# Patient Record
Sex: Male | Born: 1960 | Race: Black or African American | Hispanic: No | Smoking: Current every day smoker
Health system: Southern US, Community
[De-identification: ages and names within clinical notes are randomized; demographics above are authoritative.]

## PROBLEM LIST (undated history)

## (undated) DIAGNOSIS — J42 Unspecified chronic bronchitis: Secondary | ICD-10-CM

## (undated) DIAGNOSIS — F32A Depression, unspecified: Secondary | ICD-10-CM

## (undated) DIAGNOSIS — I10 Essential (primary) hypertension: Secondary | ICD-10-CM

## (undated) DIAGNOSIS — M1990 Unspecified osteoarthritis, unspecified site: Secondary | ICD-10-CM

## (undated) DIAGNOSIS — F329 Major depressive disorder, single episode, unspecified: Secondary | ICD-10-CM

## (undated) DIAGNOSIS — T884XXA Failed or difficult intubation, initial encounter: Secondary | ICD-10-CM

## (undated) DIAGNOSIS — S82843A Displaced bimalleolar fracture of unspecified lower leg, initial encounter for closed fracture: Secondary | ICD-10-CM

## (undated) DIAGNOSIS — S069X9A Unspecified intracranial injury with loss of consciousness of unspecified duration, initial encounter: Secondary | ICD-10-CM

## (undated) HISTORY — PX: INGUINAL HERNIA REPAIR: SUR1180

---

## 2018-01-20 ENCOUNTER — Emergency Department (HOSPITAL_COMMUNITY): Payer: Medicaid Other

## 2018-01-20 ENCOUNTER — Inpatient Hospital Stay (HOSPITAL_COMMUNITY)
Admission: EM | Admit: 2018-01-20 | Discharge: 2018-02-01 | DRG: 492 | Disposition: A | Discharge status: Home Health | Payer: Medicaid Other | Attending: General Surgery | Admitting: General Surgery

## 2018-01-20 ENCOUNTER — Encounter (HOSPITAL_COMMUNITY): Payer: Self-pay | Admitting: Emergency Medicine

## 2018-01-20 DIAGNOSIS — R402312 Coma scale, best motor response, none, at arrival to emergency department: Secondary | ICD-10-CM | POA: Diagnosis present

## 2018-01-20 DIAGNOSIS — Z23 Encounter for immunization: Secondary | ICD-10-CM | POA: Diagnosis not present

## 2018-01-20 DIAGNOSIS — R402222 Coma scale, best verbal response, incomprehensible words, at arrival to emergency department: Secondary | ICD-10-CM | POA: Diagnosis present

## 2018-01-20 DIAGNOSIS — R402112 Coma scale, eyes open, never, at arrival to emergency department: Secondary | ICD-10-CM | POA: Diagnosis present

## 2018-01-20 DIAGNOSIS — F17200 Nicotine dependence, unspecified, uncomplicated: Secondary | ICD-10-CM | POA: Diagnosis present

## 2018-01-20 DIAGNOSIS — F10129 Alcohol abuse with intoxication, unspecified: Secondary | ICD-10-CM | POA: Diagnosis present

## 2018-01-20 DIAGNOSIS — S01511A Laceration without foreign body of lip, initial encounter: Secondary | ICD-10-CM | POA: Diagnosis present

## 2018-01-20 DIAGNOSIS — S82843A Displaced bimalleolar fracture of unspecified lower leg, initial encounter for closed fracture: Secondary | ICD-10-CM

## 2018-01-20 DIAGNOSIS — J309 Allergic rhinitis, unspecified: Secondary | ICD-10-CM | POA: Diagnosis present

## 2018-01-20 DIAGNOSIS — J9601 Acute respiratory failure with hypoxia: Secondary | ICD-10-CM | POA: Diagnosis present

## 2018-01-20 DIAGNOSIS — Z09 Encounter for follow-up examination after completed treatment for conditions other than malignant neoplasm: Secondary | ICD-10-CM

## 2018-01-20 DIAGNOSIS — I10 Essential (primary) hypertension: Secondary | ICD-10-CM | POA: Diagnosis present

## 2018-01-20 DIAGNOSIS — S069XAA Unspecified intracranial injury with loss of consciousness status unknown, initial encounter: Secondary | ICD-10-CM

## 2018-01-20 DIAGNOSIS — G629 Polyneuropathy, unspecified: Secondary | ICD-10-CM | POA: Diagnosis present

## 2018-01-20 DIAGNOSIS — S9002XA Contusion of left ankle, initial encounter: Secondary | ICD-10-CM

## 2018-01-20 DIAGNOSIS — S060X9A Concussion with loss of consciousness of unspecified duration, initial encounter: Secondary | ICD-10-CM | POA: Diagnosis present

## 2018-01-20 DIAGNOSIS — S82842A Displaced bimalleolar fracture of left lower leg, initial encounter for closed fracture: Secondary | ICD-10-CM | POA: Diagnosis present

## 2018-01-20 DIAGNOSIS — M25572 Pain in left ankle and joints of left foot: Secondary | ICD-10-CM | POA: Diagnosis present

## 2018-01-20 DIAGNOSIS — S82899A Other fracture of unspecified lower leg, initial encounter for closed fracture: Secondary | ICD-10-CM

## 2018-01-20 DIAGNOSIS — R402431 Glasgow coma scale score 3-8, in the field [EMT or ambulance]: Secondary | ICD-10-CM

## 2018-01-20 DIAGNOSIS — S069X9A Unspecified intracranial injury with loss of consciousness of unspecified duration, initial encounter: Secondary | ICD-10-CM

## 2018-01-20 DIAGNOSIS — S0990XA Unspecified injury of head, initial encounter: Secondary | ICD-10-CM

## 2018-01-20 HISTORY — DX: Depression, unspecified: F32.A

## 2018-01-20 HISTORY — DX: Essential (primary) hypertension: I10

## 2018-01-20 HISTORY — DX: Unspecified osteoarthritis, unspecified site: M19.90

## 2018-01-20 HISTORY — DX: Unspecified intracranial injury with loss of consciousness of unspecified duration, initial encounter: S06.9X9A

## 2018-01-20 HISTORY — DX: Unspecified chronic bronchitis: J42

## 2018-01-20 HISTORY — DX: Unspecified intracranial injury with loss of consciousness status unknown, initial encounter: S06.9XAA

## 2018-01-20 HISTORY — DX: Failed or difficult intubation, initial encounter: T88.4XXA

## 2018-01-20 HISTORY — DX: Displaced bimalleolar fracture of unspecified lower leg, initial encounter for closed fracture: S82.843A

## 2018-01-20 HISTORY — DX: Major depressive disorder, single episode, unspecified: F32.9

## 2018-01-20 LAB — URINALYSIS, ROUTINE W REFLEX MICROSCOPIC
BACTERIA UA: NONE SEEN
BILIRUBIN URINE: NEGATIVE
Glucose, UA: NEGATIVE mg/dL
KETONES UR: NEGATIVE mg/dL
LEUKOCYTES UA: NEGATIVE
Nitrite: NEGATIVE
Protein, ur: NEGATIVE mg/dL
SQUAMOUS EPITHELIAL / LPF: NONE SEEN
Specific Gravity, Urine: 1.006 (ref 1.005–1.030)
pH: 6 (ref 5.0–8.0)

## 2018-01-20 LAB — I-STAT CG4 LACTIC ACID, ED: LACTIC ACID, VENOUS: 4.9 mmol/L — AB (ref 0.5–1.9)

## 2018-01-20 LAB — PREPARE FRESH FROZEN PLASMA
UNIT DIVISION: 0
Unit division: 0

## 2018-01-20 LAB — CBC
HCT: 37.1 % — ABNORMAL LOW (ref 39.0–52.0)
HEMOGLOBIN: 12.1 g/dL — AB (ref 13.0–17.0)
MCH: 28.5 pg (ref 26.0–34.0)
MCHC: 32.6 g/dL (ref 30.0–36.0)
MCV: 87.5 fL (ref 78.0–100.0)
Platelets: 259 10*3/uL (ref 150–400)
RBC: 4.24 MIL/uL (ref 4.22–5.81)
RDW: 16.1 % — ABNORMAL HIGH (ref 11.5–15.5)
WBC: 4.9 10*3/uL (ref 4.0–10.5)

## 2018-01-20 LAB — TYPE AND SCREEN
ABO/RH(D): O POS
ANTIBODY SCREEN: NEGATIVE
UNIT DIVISION: 0
Unit division: 0

## 2018-01-20 LAB — RAPID URINE DRUG SCREEN, HOSP PERFORMED
AMPHETAMINES: NOT DETECTED
BARBITURATES: NOT DETECTED
BENZODIAZEPINES: POSITIVE — AB
Cocaine: POSITIVE — AB
Opiates: NOT DETECTED
TETRAHYDROCANNABINOL: NOT DETECTED

## 2018-01-20 LAB — I-STAT ARTERIAL BLOOD GAS, ED
ACID-BASE DEFICIT: 3 mmol/L — AB (ref 0.0–2.0)
Bicarbonate: 22 mmol/L (ref 20.0–28.0)
O2 Saturation: 100 %
PO2 ART: 387 mmHg — AB (ref 83.0–108.0)
TCO2: 23 mmol/L (ref 22–32)
pCO2 arterial: 36.5 mmHg (ref 32.0–48.0)
pH, Arterial: 7.383 (ref 7.350–7.450)

## 2018-01-20 LAB — COMPREHENSIVE METABOLIC PANEL
ALT: 33 U/L (ref 17–63)
ANION GAP: 12 (ref 5–15)
AST: 41 U/L (ref 15–41)
Albumin: 3.3 g/dL — ABNORMAL LOW (ref 3.5–5.0)
Alkaline Phosphatase: 56 U/L (ref 38–126)
BUN: 9 mg/dL (ref 6–20)
CHLORIDE: 109 mmol/L (ref 101–111)
CO2: 18 mmol/L — AB (ref 22–32)
Calcium: 8.5 mg/dL — ABNORMAL LOW (ref 8.9–10.3)
Creatinine, Ser: 1.16 mg/dL (ref 0.61–1.24)
GFR calc non Af Amer: >60 mL/min (ref >60)
Glucose, Bld: 118 mg/dL — ABNORMAL HIGH (ref 65–99)
Potassium: 3 mmol/L — ABNORMAL LOW (ref 3.5–5.1)
Sodium: 139 mmol/L (ref 135–145)
Total Bilirubin: 0.4 mg/dL (ref 0.3–1.2)
Total Protein: 5.8 g/dL — ABNORMAL LOW (ref 6.5–8.1)

## 2018-01-20 LAB — I-STAT CHEM 8, ED
BUN: 9 mg/dL (ref 6–20)
CALCIUM ION: 1.09 mmol/L — AB (ref 1.15–1.40)
Chloride: 108 mmol/L (ref 101–111)
Creatinine, Ser: 1.4 mg/dL — ABNORMAL HIGH (ref 0.61–1.24)
GLUCOSE: 114 mg/dL — AB (ref 65–99)
HCT: 39 % (ref 39.0–52.0)
HEMOGLOBIN: 13.3 g/dL (ref 13.0–17.0)
POTASSIUM: 3 mmol/L — AB (ref 3.5–5.1)
Sodium: 143 mmol/L (ref 135–145)
TCO2: 20 mmol/L — ABNORMAL LOW (ref 22–32)

## 2018-01-20 LAB — TRIGLYCERIDES: Triglycerides: 99 mg/dL (ref <150)

## 2018-01-20 LAB — BPAM RBC
BLOOD PRODUCT EXPIRATION DATE: 201904202359
BLOOD PRODUCT EXPIRATION DATE: 201904202359
ISSUE DATE / TIME: 201904130224
ISSUE DATE / TIME: 201904130224
UNIT TYPE AND RH: 9500
Unit Type and Rh: 9500

## 2018-01-20 LAB — PROTIME-INR
INR: 0.98
Prothrombin Time: 12.9 seconds (ref 11.4–15.2)

## 2018-01-20 LAB — ETHANOL: Alcohol, Ethyl (B): 181 mg/dL — ABNORMAL HIGH (ref <10)

## 2018-01-20 LAB — BPAM FFP
Blood Product Expiration Date: 201904132359
Blood Product Expiration Date: 201904142359
ISSUE DATE / TIME: 201904130225
ISSUE DATE / TIME: 201904130225
UNIT TYPE AND RH: 6200
Unit Type and Rh: 6200

## 2018-01-20 LAB — CDS SEROLOGY

## 2018-01-20 LAB — ABO/RH: ABO/RH(D): O POS

## 2018-01-20 LAB — HIV ANTIBODY (ROUTINE TESTING W REFLEX): HIV Screen 4th Generation wRfx: NONREACTIVE

## 2018-01-20 MED ORDER — ROCURONIUM BROMIDE 50 MG/5ML IV SOLN
INTRAVENOUS | Status: AC | PRN
  Administered 2018-01-20: 100 mg via INTRAVENOUS

## 2018-01-20 MED ORDER — SODIUM CHLORIDE 0.9 % IV SOLN
INTRAVENOUS | Status: DC
  Administered 2018-01-20 – 2018-01-22 (×6): via INTRAVENOUS

## 2018-01-20 MED ORDER — FENTANYL CITRATE (PF) 100 MCG/2ML IJ SOLN
INTRAMUSCULAR | Status: AC
  Filled 2018-01-20: qty 2

## 2018-01-20 MED ORDER — FENTANYL CITRATE (PF) 100 MCG/2ML IJ SOLN
INTRAMUSCULAR | Status: AC
  Administered 2018-01-20: 05:00:00
  Filled 2018-01-20: qty 2

## 2018-01-20 MED ORDER — ORAL CARE MOUTH RINSE: 15.0000 mL | OROMUCOSAL | Status: DC

## 2018-01-20 MED ORDER — LIDOCAINE HCL (CARDIAC) 20 MG/ML IV SOLN
INTRAVENOUS | Status: AC | PRN
  Administered 2018-01-20: 100 mg via INTRAVENOUS

## 2018-01-20 MED ORDER — ETOMIDATE 2 MG/ML IV SOLN
INTRAVENOUS | Status: AC | PRN
  Administered 2018-01-20: 20 mg via INTRAVENOUS

## 2018-01-20 MED ORDER — FENTANYL CITRATE (PF) 100 MCG/2ML IJ SOLN
100.0000 ug | INTRAMUSCULAR | Status: DC | PRN
  Administered 2018-01-20 (×2): 100 ug via INTRAVENOUS
  Filled 2018-01-20 (×2): qty 2

## 2018-01-20 MED ORDER — ENOXAPARIN SODIUM 40 MG/0.4ML ~~LOC~~ SOLN
40.0000 mg | SUBCUTANEOUS | Status: DC
  Administered 2018-01-21 – 2018-02-01 (×11): 40 mg via SUBCUTANEOUS
  Filled 2018-01-20 (×11): qty 0.4

## 2018-01-20 MED ORDER — SODIUM CHLORIDE 0.9 % IV BOLUS
1000.0000 mL | Freq: Once | INTRAVENOUS | Status: AC
  Administered 2018-01-20: 1000 mL via INTRAVENOUS

## 2018-01-20 MED ORDER — MIDAZOLAM HCL 2 MG/2ML IJ SOLN
2.0000 mg | INTRAMUSCULAR | Status: DC | PRN
  Administered 2018-01-20 (×2): 2 mg via INTRAVENOUS
  Filled 2018-01-20 (×2): qty 2

## 2018-01-20 MED ORDER — ORAL CARE MOUTH RINSE
15.0000 mL | Freq: Four times a day (QID) | OROMUCOSAL | Status: DC
  Administered 2018-01-20 – 2018-01-21 (×7): 15 mL via OROMUCOSAL

## 2018-01-20 MED ORDER — POTASSIUM CHLORIDE 10 MEQ/100ML IV SOLN
10.0000 meq | INTRAVENOUS | Status: AC
  Administered 2018-01-20 (×3): 10 meq via INTRAVENOUS
  Filled 2018-01-20 (×3): qty 100

## 2018-01-20 MED ORDER — CHLORHEXIDINE GLUCONATE 0.12% ORAL RINSE (MEDLINE KIT)
15.0000 mL | Freq: Two times a day (BID) | OROMUCOSAL | Status: DC
  Administered 2018-01-20 – 2018-01-21 (×3): 15 mL via OROMUCOSAL

## 2018-01-20 MED ORDER — CHLORHEXIDINE GLUCONATE 0.12% ORAL RINSE (MEDLINE KIT): 15.0000 mL | Freq: Two times a day (BID) | OROMUCOSAL | Status: DC

## 2018-01-20 MED ORDER — PROPOFOL 1000 MG/100ML IV EMUL
5.0000 ug/kg/min | INTRAVENOUS | Status: DC
  Administered 2018-01-20: 25 ug/kg/min via INTRAVENOUS
  Administered 2018-01-20: 60 ug/kg/min via INTRAVENOUS
  Administered 2018-01-20: 40 ug/kg/min via INTRAVENOUS
  Administered 2018-01-21: 60 ug/kg/min via INTRAVENOUS
  Administered 2018-01-21: 50 ug/kg/min via INTRAVENOUS
  Filled 2018-01-20 (×4): qty 100

## 2018-01-20 MED ORDER — IOPAMIDOL (ISOVUE-300) INJECTION 61%
100.0000 mL | Freq: Once | INTRAVENOUS | Status: AC | PRN
  Administered 2018-01-20: 100 mL via INTRAVENOUS

## 2018-01-20 MED ORDER — MIDAZOLAM HCL 2 MG/2ML IJ SOLN
INTRAMUSCULAR | Status: AC
  Filled 2018-01-20: qty 2

## 2018-01-20 MED ORDER — MIDAZOLAM HCL 2 MG/2ML IJ SOLN
2.0000 mg | INTRAMUSCULAR | Status: DC | PRN
  Administered 2018-01-20 (×4): 2 mg via INTRAVENOUS
  Filled 2018-01-20 (×3): qty 2

## 2018-01-20 MED ORDER — FENTANYL CITRATE (PF) 100 MCG/2ML IJ SOLN
100.0000 ug | INTRAMUSCULAR | Status: DC | PRN
  Administered 2018-01-20 – 2018-01-21 (×7): 100 ug via INTRAVENOUS
  Filled 2018-01-20 (×6): qty 2

## 2018-01-20 MED ORDER — PROPOFOL 1000 MG/100ML IV EMUL
INTRAVENOUS | Status: AC
  Filled 2018-01-20: qty 100

## 2018-01-20 MED ORDER — METOPROLOL TARTRATE 5 MG/5ML IV SOLN: 5.0000 mg | Freq: Four times a day (QID) | INTRAVENOUS | Status: DC | PRN

## 2018-01-20 MED ORDER — MIDAZOLAM HCL 2 MG/2ML IJ SOLN
INTRAMUSCULAR | Status: AC
  Administered 2018-01-20: 05:00:00
  Filled 2018-01-20: qty 2

## 2018-01-20 NOTE — Progress Notes (Signed)
Please call Detective J.A. Morataya [phone]) with any patient changes. She would like to know if patient wakes up or declines in condition.

## 2018-01-20 NOTE — ED Notes (Signed)
No purposeful movement at this time. 
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
## 2018-01-20 NOTE — Progress Notes (Signed)
Patient awakening and beating on bed rails with bilateral UE. Nods appropriately and following commands. PRN Fentanyl and Versed given for patient safety and cooperation. MD paged and Propofol gtt ordered. Will initiate and work towards SBT with possible extubation. RT notified. Will continue to monitor.   Ulice Dashorie Charmayne Odell, RCharity fundraiser

## 2018-01-20 NOTE — Progress Notes (Signed)
Pt on vent but waking up  Will use propofol for now and try to wean him  Not sure etiology of MS change but work up negative   May be drug/ ETOH related
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
## 2018-01-21 ENCOUNTER — Inpatient Hospital Stay (HOSPITAL_COMMUNITY): Payer: Medicaid Other

## 2018-01-21 LAB — CBC WITH DIFFERENTIAL/PLATELET
BASOS ABS: 0 10*3/uL (ref 0.0–0.1)
BASOS PCT: 0 %
EOS ABS: 0.1 10*3/uL (ref 0.0–0.7)
EOS PCT: 1 %
HCT: 36.6 % — ABNORMAL LOW (ref 39.0–52.0)
Hemoglobin: 11.5 g/dL — ABNORMAL LOW (ref 13.0–17.0)
Lymphocytes Relative: 25 %
Lymphs Abs: 1.9 10*3/uL (ref 0.7–4.0)
MCH: 27.8 pg (ref 26.0–34.0)
MCHC: 31.4 g/dL (ref 30.0–36.0)
MCV: 88.6 fL (ref 78.0–100.0)
Monocytes Absolute: 0.6 10*3/uL (ref 0.1–1.0)
Monocytes Relative: 8 %
Neutro Abs: 5 10*3/uL (ref 1.7–7.7)
Neutrophils Relative %: 66 %
PLATELETS: 235 10*3/uL (ref 150–400)
RBC: 4.13 MIL/uL — ABNORMAL LOW (ref 4.22–5.81)
RDW: 16.5 % — ABNORMAL HIGH (ref 11.5–15.5)
WBC: 7.6 10*3/uL (ref 4.0–10.5)

## 2018-01-21 LAB — MRSA PCR SCREENING: MRSA BY PCR: POSITIVE — AB

## 2018-01-21 LAB — BASIC METABOLIC PANEL
Anion gap: 8 (ref 5–15)
BUN: 5 mg/dL — AB (ref 6–20)
CO2: 26 mmol/L (ref 22–32)
Calcium: 8 mg/dL — ABNORMAL LOW (ref 8.9–10.3)
Chloride: 106 mmol/L (ref 101–111)
Creatinine, Ser: 1.01 mg/dL (ref 0.61–1.24)
Glucose, Bld: 104 mg/dL — ABNORMAL HIGH (ref 65–99)
Potassium: 3.6 mmol/L (ref 3.5–5.1)
SODIUM: 140 mmol/L (ref 135–145)

## 2018-01-21 LAB — SAMPLE TO BLOOD BANK

## 2018-01-21 MED ORDER — MUPIROCIN 2 % EX OINT
1.0000 "application " | TOPICAL_OINTMENT | Freq: Two times a day (BID) | CUTANEOUS | Status: AC
  Administered 2018-01-21 – 2018-01-25 (×9): 1 via NASAL
  Filled 2018-01-21 (×2): qty 22

## 2018-01-21 MED ORDER — DEXMEDETOMIDINE HCL IN NACL 200 MCG/50ML IV SOLN
0.4000 ug/kg/h | INTRAVENOUS | Status: DC
  Administered 2018-01-21 (×2): 0.4 ug/kg/h via INTRAVENOUS
  Filled 2018-01-21 (×2): qty 50

## 2018-01-21 MED ORDER — HYDROMORPHONE HCL 1 MG/ML IJ SOLN
1.0000 mg | INTRAMUSCULAR | Status: DC | PRN
  Administered 2018-01-21 – 2018-01-22 (×6): 1 mg via INTRAVENOUS
  Filled 2018-01-21 (×6): qty 1

## 2018-01-21 MED ORDER — CHLORHEXIDINE GLUCONATE CLOTH 2 % EX PADS
6.0000 | MEDICATED_PAD | Freq: Every day | CUTANEOUS | Status: AC
  Administered 2018-01-21 – 2018-01-25 (×5): 6 via TOPICAL

## 2018-01-21 MED ORDER — ORAL CARE MOUTH RINSE
15.0000 mL | Freq: Two times a day (BID) | OROMUCOSAL | Status: DC
  Administered 2018-01-21 – 2018-01-31 (×17): 15 mL via OROMUCOSAL

## 2018-01-21 NOTE — Progress Notes (Signed)
   01/21/18 0800  Vitals  Temp 100 F (37.8 C)  Temp Source Bladder  BP 122/88  MAP (mmHg) 101  BP Location Right Arm  BP Method Automatic  Patient Position (if appropriate) Lying  Pulse Rate 70  Pulse Rate Source Monitor  ECG Heart Rate 74  Cardiac Rhythm NSR  Resp 20  Oxygen Therapy  SpO2 100 %  O2 Device Ventilator  FiO2 (%) 30 %  Pre-WUA / WUA Start  [place] Agitation Sedation Scale (RASS) 2  Pre WUA sedation dose Continuous  RASS Goal -1  Follows commands Yes  Synchrony No  Wake up assessment interventions Sedation decreased 50%  Glasgow Coma Scale  Eye Opening 3  Modified Verbal Response (INTUBATED) 3  Best Motor Response 6  Glasgow Coma Scale Score 12  Provider Notification  Provider Name/Title Esmond Harps White, MD  Time Notified 0800  Notification Type Face-to-face  Notification Reason Other (Comment) (extubate? MRSA?)  Response See new orders  Time of Response 0800

## 2018-01-21 NOTE — Progress Notes (Signed)
   01/21/18 1310  Vitals  Temp 98.8 F (37.1 C)  Pulse Rate 60  ECG Heart Rate (!) 59  Resp 14  Oxygen Therapy  SpO2 100 %  Provider Notification  Provider Name/Title Corliss Skainssuei, MD  Time Notified 1310  Notification Type Page  Notification Reason Other (Comment) (extubation orders)  Response See new orders  Time of Response 1320 (Dr Esmond Harps White)

## 2018-01-21 NOTE — Procedures (Signed)
Extubation Procedure Note  Patient Details:   Name: Wesley Kelly DOB: 1961-05-13 MRN: [id number]   Airway Documentation:  Airway 7.5 mm (Active)  Secured at (cm) 25 cm 01/21/2018 11:25 AM  Measured From Lips 01/21/2018 11:25 AM  Secured Location Right 01/21/2018 11:25 AM  Secured By [employer] Tube Holder 01/21/2018 11:25 AM  Tube Holder Repositioned Yes 01/21/2018 11:25 AM  Cuff Pressure (cm H2O) 26 cm H2O 01/21/2018  4:35 AM  Site Condition Dry 01/21/2018 11:25 AM    Evaluation  O2 sats: stable throughout Complications: No apparent complications Patient did tolerate procedure well. Bilateral Breath Sounds: Clear   Yes   Patient extubated to 4lnc. Vital signs stable at this time. No complications. RN at bedside. RT will continue to monitor.  Ave Filterdkins, Jolena Kittle Williams 01/21/2018, 2:28 PM

## 2018-01-21 NOTE — Progress Notes (Signed)
Orthopedic Tech Progress Note Patient Details:  Wesley Kelly 1961/05/05 [id number]  Ortho Devices Type of Ortho Device: Ace wrap, Post (short leg) splint Ortho Device/Splint Location: LLE Ortho Device/Splint Interventions: Ordered, Application   Post Interventions Patient Tolerated: Well Instructions Provided: Care of device   Jennye MoccasinHughes, [name] 01/21/2018, 5:45 PM

## 2018-01-21 NOTE — Progress Notes (Signed)
   01/21/18 1524  Vitals  Temp 98.8 F (37.1 C)  Pulse Rate (!) 59  ECG Heart Rate (!) 55  Resp 14  Oxygen Therapy  SpO2 100 %  Provider Notification  Provider Name/Title Corliss Skainssuei, MD  Time Notified 1324  Notification Type Page  Notification Reason Requested by patient/family (ankle hurts, xray?)  Response See new orders  Time of Response 1324

## 2018-01-21 NOTE — Progress Notes (Signed)
Patient ID: Wesley Kelly, male   DOB: 1960/12/03, 57 y.o.   MRN: [id number]  Patient now extubated.  Awake and alert.  Complaining to nurse about left ankle pain and swelling.  X-rays - preliminary read left fibula fracture at the ankle.  I spoke with Dr. Everardo PacificVarkey - will splint ankle and he will see patient later.  Keep left foot elevated.  Wilmon ArmsMatthew K. Corliss Skainssuei, MD, [facility]  General/ Trauma Surgery  01/21/2018 4:13 PM

## 2018-01-21 NOTE — Progress Notes (Signed)
Follow up - Trauma Critical Care  Patient Details:    Wesley Kelly is an 57 y.o. male.  Lines/tubes : Airway 7.5 mm (Active)  Secured at (cm) 25 cm 01/21/2018  7:42 AM  Measured From Lips 01/21/2018  7:42 AM  Secured Location Center 01/21/2018  7:42 AM  Secured By [employer] 01/21/2018  7:42 AM  Tube Holder Repositioned Yes 01/21/2018  7:42 AM  Cuff Pressure (cm H2O) 26 cm H2O 01/21/2018  4:35 AM  Site Condition Dry 01/21/2018  7:42 AM     NG/OG Tube Orogastric 18 Fr. (Active)  Site Assessment Clean;Dry;Intact 01/20/2018  8:00 PM  Ongoing Placement Verification No change in cm markings or external length of tube from initial placement;No change in respiratory status;No acute changes, not attributed to clinical condition 01/20/2018  8:00 PM  Status Suction-low intermittent 01/20/2018  8:00 PM  Drainage Appearance Bile;Brown 01/20/2018  8:00 PM  Output (mL) 350 mL 01/21/2018  6:00 AM     Urethral Catheter EOlsen RN Latex;Temperature probe;Straight-tip 16 Fr. (Active)  Indication for Insertion or Continuance of Catheter Unstable critical patients (first 24-48 hours) 01/20/2018  8:00 PM  Site Assessment Clean;Intact 01/20/2018  8:00 PM  Catheter Maintenance Catheter secured;Drainage bag/tubing not touching floor;Bag below level of bladder;Insertion date on drainage bag;No dependent loops;Seal intact 01/20/2018  8:00 PM  Collection Container Standard drainage bag 01/20/2018  8:00 PM  Securement Method Securing device (Describe) 01/20/2018  8:00 PM  Urinary Catheter Interventions Unclamped 01/20/2018  8:00 PM  Output (mL) 200 mL 01/21/2018  6:00 AM    Microbiology/Sepsis markers: No results found for this or any previous visit.  Anti-infectives:  Anti-infectives (From admission, onward)   None      Best Practice/Protocols:  VTE Prophylaxis: Lovenox (prophylaxtic dose) and Mechanical  Consults: None   Events: UDS returned positive for cocaine.   Subjective:    Overnight  Issues: None; intubated but working towards extubation - extreme agitation when sedation held. Switching over to precedex today.  Objective:  Vital signs for last 24 hours: Temp:  [97.6 F (36.4 C)-100.6 F (38.1 C)] 100 F (37.8 C) (04/14 0800) Pulse Rate:  [42-92] 70 (04/14 0800) Resp:  [12-23] 20 (04/14 0800) BP: (106-145)/(73-111) 122/88 (04/14 0800) SpO2:  [100 %] 100 % (04/14 0800) FiO2 (%):  [30 %-50 %] 30 % (04/14 0800)  Hemodynamic parameters for last 24 hours:    Intake/Output from previous day: 04/13 0701 - 04/14 0700 In: 2790.9 [I.V.:2690.9; IV Piggyback:100] Out: 2925 [Urine:1875; Emesis/NG output:1050]  Intake/Output this shift: No intake/output data recorded.  Vent settings for last 24 hours: Vent Mode: CPAP;PSV FiO2 (%):  [30 %-50 %] 30 % Set Rate:  [18 bmp] 18 bmp Vt Set:  [500 mL] 500 mL PEEP:  [5 cmH20] 5 cmH20 Pressure Support:  [8 cmH20] 8 cmH20 Plateau Pressure:  [17 cmH20-19 cmH20] 17 cmH20  Physical Exam:  General: no respiratory distress and on ventilator Neuro: RASS -1 HEENT/Neck: no JVD and ETT WNL  Resp: clear to auscultation bilaterally CVS: RRR GI: Abdomen soft, NT/ND Skin: no rash Extremities: no edema, no erythema, pulses WNL  Results for orders placed or performed during the hospital encounter of 01/20/18 (from the past 24 hour(s))  Urine rapid drug screen (hosp performed)     Status: Abnormal   Collection Time: 01/20/18  5:06 PM  Result Value Ref Range   Opiates NONE DETECTED NONE DETECTED   Cocaine POSITIVE (A) NONE DETECTED   Benzodiazepines POSITIVE (A) NONE  DETECTED   Amphetamines NONE DETECTED NONE DETECTED   Tetrahydrocannabinol NONE DETECTED NONE DETECTED   Barbiturates NONE DETECTED NONE DETECTED  CBC with Differential/Platelet     Status: Abnormal   Collection Time: 01/21/18  8:10 AM  Result Value Ref Range   WBC 7.6 4.0 - 10.5 K/uL   RBC 4.13 (L) 4.22 - 5.81 MIL/uL   Hemoglobin 11.5 (L) 13.0 - 17.0 g/dL   HCT 16.136.6  (L) 09.639.0 - 52.0 %   MCV 88.6 78.0 - 100.0 fL   MCH 27.8 26.0 - 34.0 pg   MCHC 31.4 30.0 - 36.0 g/dL   RDW 04.516.5 (H) 40.911.5 - 81.115.5 %   Platelets 235 150 - 400 K/uL   Neutrophils Relative % 66 %   Neutro Abs 5.0 1.7 - 7.7 K/uL   Lymphocytes Relative 25 %   Lymphs Abs 1.9 0.7 - 4.0 K/uL   Monocytes Relative 8 %   Monocytes Absolute 0.6 0.1 - 1.0 K/uL   Eosinophils Relative 1 %   Eosinophils Absolute 0.1 0.0 - 0.7 K/uL   Basophils Relative 0 %   Basophils Absolute 0.0 0.0 - 0.1 K/uL    Assessment & Plan: Present on Admission: . Traumatic brain injury (HCC)  NEURO  Altered Mental Status:agitation, sedation   Plan:Trying to get the proper combination to allow ventilator weaning without complete agitation  PULM  Atelectasis/collapse (bibasilar with effusion.) Bilateral effusion.   Plan:Wean as tolerated  CARDIO  No particular cardiac issues   Plan:CPM  RENAL  Cr normal, adequate UOP   Plan:CPM  GI  No issues   Plan:TFs if unable to extubate later today  ID  No known infectious sources   Plan:CPM  HEME  Hgb 11.5 from 13   Plan:Trend; no known or identifiable sources of bleeding; likely dilutional  ENDO No changes   Plan:CPM  Global Issues  Significant agitation when propofol held; UDS + for cocaine, EtOH 181 on admit. Working for smoother transition - switching to Precedex today.      LOS: 1 day    Critical Care Total Time*: 32 minutes  Stephanie Couphristopher M. Cliffton AstersWhite, M.D. [facility]  01/21/2018  *Care during the described time interval was provided by me. I have personally reviewed this patient's available data, including medical history, events of note, physical examination and test results as part of my evaluation and the above time reflects this and time spent coordinating care.  Note: This dictation was prepared with Dragon/digital dictation along with [employer] technology. Any transcriptional errors that result from this process  are unintentional.

## 2018-01-22 ENCOUNTER — Inpatient Hospital Stay (HOSPITAL_COMMUNITY): Payer: Medicaid Other

## 2018-01-22 HISTORY — PX: CLOSED REDUCTION ANKLE FRACTURE: SUR210

## 2018-01-22 LAB — BASIC METABOLIC PANEL
ANION GAP: 6 (ref 5–15)
BUN: 5 mg/dL — ABNORMAL LOW (ref 6–20)
CALCIUM: 7.9 mg/dL — AB (ref 8.9–10.3)
CO2: 26 mmol/L (ref 22–32)
Chloride: 107 mmol/L (ref 101–111)
Creatinine, Ser: 1.05 mg/dL (ref 0.61–1.24)
GFR calc Af Amer: >60 mL/min (ref >60)
GLUCOSE: 92 mg/dL (ref 65–99)
Potassium: 3.7 mmol/L (ref 3.5–5.1)
SODIUM: 139 mmol/L (ref 135–145)

## 2018-01-22 LAB — CBC WITH DIFFERENTIAL/PLATELET
BASOS ABS: 0 10*3/uL (ref 0.0–0.1)
BASOS PCT: 0 %
EOS ABS: 0.1 10*3/uL (ref 0.0–0.7)
EOS PCT: 1 %
HCT: 32.2 % — ABNORMAL LOW (ref 39.0–52.0)
Hemoglobin: 10.5 g/dL — ABNORMAL LOW (ref 13.0–17.0)
LYMPHS PCT: 28 %
Lymphs Abs: 1.9 10*3/uL (ref 0.7–4.0)
MCH: 29.2 pg (ref 26.0–34.0)
MCHC: 32.6 g/dL (ref 30.0–36.0)
MCV: 89.7 fL (ref 78.0–100.0)
MONO ABS: 0.4 10*3/uL (ref 0.1–1.0)
Monocytes Relative: 6 %
Neutro Abs: 4.6 10*3/uL (ref 1.7–7.7)
Neutrophils Relative %: 65 %
PLATELETS: 223 10*3/uL (ref 150–400)
RBC: 3.59 MIL/uL — AB (ref 4.22–5.81)
RDW: 16.2 % — ABNORMAL HIGH (ref 11.5–15.5)
WBC: 7.1 10*3/uL (ref 4.0–10.5)

## 2018-01-22 MED ORDER — PNEUMOCOCCAL VAC POLYVALENT 25 MCG/0.5ML IJ INJ
0.5000 mL | INJECTION | INTRAMUSCULAR | Status: AC
  Administered 2018-01-23: 0.5 mL via INTRAMUSCULAR
  Filled 2018-01-22: qty 0.5

## 2018-01-22 MED ORDER — HYDROMORPHONE HCL 2 MG/ML IJ SOLN
1.0000 mg | INTRAMUSCULAR | Status: DC | PRN
  Administered 2018-01-23 – 2018-01-29 (×11): 1 mg via INTRAVENOUS
  Filled 2018-01-22 (×13): qty 1

## 2018-01-22 MED ORDER — OXYCODONE HCL 5 MG PO TABS
5.0000 mg | ORAL_TABLET | ORAL | Status: DC | PRN
  Administered 2018-01-22 – 2018-01-31 (×27): 10 mg via ORAL
  Filled 2018-01-22 (×28): qty 2

## 2018-01-22 MED ORDER — ACETAMINOPHEN 325 MG PO TABS
650.0000 mg | ORAL_TABLET | Freq: Four times a day (QID) | ORAL | Status: DC | PRN
  Administered 2018-01-22 – 2018-01-23 (×3): 650 mg via ORAL
  Filled 2018-01-22 (×3): qty 2

## 2018-01-22 MED ORDER — PREGABALIN 75 MG PO CAPS
75.0000 mg | ORAL_CAPSULE | Freq: Two times a day (BID) | ORAL | Status: DC
Start: 2018-01-22 — End: 2018-02-01
  Administered 2018-01-22 – 2018-02-01 (×20): 75 mg via ORAL
  Filled 2018-01-22 (×20): qty 1

## 2018-01-22 MED ORDER — MIDAZOLAM HCL 2 MG/2ML IJ SOLN
4.0000 mg | Freq: Once | INTRAMUSCULAR | Status: AC
  Administered 2018-01-22: 4 mg via INTRAVENOUS
  Filled 2018-01-22: qty 4

## 2018-01-22 MED ORDER — DIPHENHYDRAMINE HCL 50 MG/ML IJ SOLN
25.0000 mg | Freq: Four times a day (QID) | INTRAMUSCULAR | Status: DC | PRN
  Administered 2018-01-22 – 2018-02-01 (×6): 25 mg via INTRAVENOUS
  Filled 2018-01-22 (×6): qty 1

## 2018-01-22 MED ORDER — HYDROMORPHONE HCL 1 MG/ML IJ SOLN: 1.0000 mg | INTRAMUSCULAR | Status: DC | PRN

## 2018-01-22 MED ORDER — FENTANYL CITRATE (PF) 100 MCG/2ML IJ SOLN
100.0000 ug | Freq: Once | INTRAMUSCULAR | Status: AC
  Administered 2018-01-22: 100 ug via INTRAVENOUS
  Filled 2018-01-22: qty 2

## 2018-01-22 NOTE — Procedures (Signed)
Procedure: Left ankle closed reduction  Indication: Left ankle fracture/dislocation  Surgeon: Charma IgoMichael Thedora Rings, PA-C  Assist: None  Anesthesia: Conscious sedation per trauma  EBL: None  Complications: None  Findings: After risks/benefits explained patient desires to undergo procedure. Consent obtained and time out performed. After adequate sedation ankle reduced and splinted. X-ray pending.    Freeman CaldronMichael J. Lamira Borin, PA-C Orthopedic Surgery [phone]

## 2018-01-22 NOTE — Progress Notes (Addendum)
2130- Patient complaining of pain in Left ankle. No pain meds ordered for pain. MD made aware, new orders to D/C fent and versed per RASS and add dilaudid PRN
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
## 2018-01-22 NOTE — Progress Notes (Signed)
Orthopedic Tech Progress Note Patient Details:  Chase PicketRonnie W XXXColes 1960/11/19 [id number]  Ortho Devices Type of Ortho Device: Ace wrap, Short leg splint, Stirrup splint Ortho Device/Splint Location: LLE Ortho Device/Splint Interventions: Application   Post Interventions Patient Tolerated: Well Instructions Provided: Care of device   Saul FordyceJennifer C Markale Birdsell 01/22/2018, 3:07 PM

## 2018-01-22 NOTE — Progress Notes (Addendum)
Patient ID: Wesley Kelly, male   DOB: 1960-12-03, 57 y.o.   MRN: [id number] Conscious sedation note  Informed consent was obtained. Patient was monitored in the ICU throughout. Supplemental O2 was given. He was given versed 4mg  and fentanyl 100mcg for sedation to undergo closed reduction L ankle by orthopedics. He remained stable throughout with good O2 saturations. He tolerated it well with no complicaitons.  Total time 15min  Violeta GelinasBurke Fernand Sorbello, MD, MPH, FACS Trauma: [phone] General Surgery: [phone]
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
## 2018-01-23 ENCOUNTER — Encounter (HOSPITAL_COMMUNITY): Payer: Self-pay | Admitting: General Practice

## 2018-01-23 ENCOUNTER — Other Ambulatory Visit: Payer: Self-pay

## 2018-01-23 LAB — CBC WITH DIFFERENTIAL/PLATELET
BASOS PCT: 0 %
Basophils Absolute: 0 10*3/uL (ref 0.0–0.1)
EOS ABS: 0 10*3/uL (ref 0.0–0.7)
Eosinophils Relative: 1 %
HEMATOCRIT: 35.1 % — AB (ref 39.0–52.0)
HEMOGLOBIN: 11.5 g/dL — AB (ref 13.0–17.0)
LYMPHS ABS: 1.4 10*3/uL (ref 0.7–4.0)
Lymphocytes Relative: 17 %
MCH: 28.7 pg (ref 26.0–34.0)
MCHC: 32.8 g/dL (ref 30.0–36.0)
MCV: 87.5 fL (ref 78.0–100.0)
MONOS PCT: 5 %
Monocytes Absolute: 0.5 10*3/uL (ref 0.1–1.0)
NEUTROS ABS: 6.5 10*3/uL (ref 1.7–7.7)
NEUTROS PCT: 77 %
Platelets: 289 10*3/uL (ref 150–400)
RBC: 4.01 MIL/uL — AB (ref 4.22–5.81)
RDW: 15.4 % (ref 11.5–15.5)
WBC: 8.4 10*3/uL (ref 4.0–10.5)

## 2018-01-23 LAB — BASIC METABOLIC PANEL
Anion gap: 9 (ref 5–15)
BUN: <5 mg/dL — ABNORMAL LOW (ref 6–20)
CHLORIDE: 105 mmol/L (ref 101–111)
CO2: 25 mmol/L (ref 22–32)
Calcium: 8.3 mg/dL — ABNORMAL LOW (ref 8.9–10.3)
Creatinine, Ser: 1 mg/dL (ref 0.61–1.24)
GFR calc non Af Amer: >60 mL/min (ref >60)
Glucose, Bld: 96 mg/dL (ref 65–99)
POTASSIUM: 3.6 mmol/L (ref 3.5–5.1)
SODIUM: 139 mmol/L (ref 135–145)

## 2018-01-23 MED ORDER — POVIDONE-IODINE 10 % EX SWAB: 2.0000 "application " | Freq: Once | CUTANEOUS | Status: DC

## 2018-01-23 MED ORDER — CEFAZOLIN SODIUM-DEXTROSE 2-4 GM/100ML-% IV SOLN
2.0000 g | INTRAVENOUS | Status: AC
  Administered 2018-01-24: 2 g via INTRAVENOUS

## 2018-01-23 MED ORDER — CHLORHEXIDINE GLUCONATE 4 % EX LIQD: 60.0000 mL | Freq: Once | CUTANEOUS | Status: DC

## 2018-01-23 MED ORDER — CEFAZOLIN SODIUM-DEXTROSE 2-4 GM/100ML-% IV SOLN
2.0000 g | INTRAVENOUS | Status: DC
  Filled 2018-01-23: qty 100

## 2018-01-23 MED ORDER — ENSURE ENLIVE PO LIQD
237.0000 mL | Freq: Two times a day (BID) | ORAL | Status: DC
  Administered 2018-01-23 – 2018-02-01 (×12): 237 mL via ORAL
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
## 2018-01-23 NOTE — Progress Notes (Addendum)
CSW received handoff from daytime CSW. ED CSW met with pt at pt's bedside. CSW discussed with pt, pt's plans once discharged from the hospital. Pt stated he will be going to live with his sister in [place], once discharged. Pt stated he was in a good mood because his family visited earlier this afternoon. Pt is thankful that he can live with his sister, but is looking forward to getting his own apartment. Pt is retired and moved back down to [place] from [place] about a week ago.    CSW completed SBIRT with pt. 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 Pt stated he used cocaine for about a year and half before stopping 2 years ago. When asked about positive blood work, pt denied use recently, stating he drinks two beers every Friday. CSW asked about supports to stay clean when discharged from the hospital. Pt stated that his family is a strong support in helping him stay clean. SBIRT completed in chart.   Wendelyn Breslow, [facility]  [phone]

## 2018-01-23 NOTE — H&P (View-Only) (Signed)
Talked with patient again today.  Plan for OR tomorrow 4/17 for ORIF ankle.  All questions answered.  NPO at midnight.  Splint in place with NVID status distally. 
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
## 2018-01-23 NOTE — Progress Notes (Signed)
Talked with patient again today.  Plan for OR tomorrow 4/17 for ORIF ankle.  All questions answered.  NPO at midnight.  Splint in place with NVID status distally.

## 2018-01-24 ENCOUNTER — Encounter (HOSPITAL_COMMUNITY): Payer: Self-pay | Admitting: Orthopedic Surgery

## 2018-01-24 ENCOUNTER — Inpatient Hospital Stay (HOSPITAL_COMMUNITY): Payer: Medicaid Other

## 2018-01-24 ENCOUNTER — Inpatient Hospital Stay (HOSPITAL_COMMUNITY): Payer: Medicaid Other | Admitting: Certified Registered Nurse Anesthetist

## 2018-01-24 ENCOUNTER — Encounter (HOSPITAL_COMMUNITY): Admission: EM | Disposition: A | Discharge status: Home Health | Payer: Self-pay | Source: Home / Self Care

## 2018-01-24 HISTORY — PX: ORIF ANKLE FRACTURE: SHX5408

## 2018-01-24 SURGERY — OPEN REDUCTION INTERNAL FIXATION (ORIF) ANKLE FRACTURE
Anesthesia: Regional | Site: Ankle | Laterality: Left

## 2018-01-24 MED ORDER — MIDAZOLAM HCL 2 MG/2ML IJ SOLN
INTRAMUSCULAR | Status: AC
  Filled 2018-01-24: qty 2

## 2018-01-24 MED ORDER — FENTANYL CITRATE (PF) 250 MCG/5ML IJ SOLN
INTRAMUSCULAR | Status: DC | PRN
  Administered 2018-01-24: 50 ug via INTRAVENOUS
  Administered 2018-01-24: 25 ug via INTRAVENOUS

## 2018-01-24 MED ORDER — MIDAZOLAM HCL 2 MG/2ML IJ SOLN
INTRAMUSCULAR | Status: AC
  Administered 2018-01-24: 2 mg via INTRAVENOUS
  Filled 2018-01-24: qty 2

## 2018-01-24 MED ORDER — LIDOCAINE HCL (CARDIAC) PF 100 MG/5ML IV SOSY
PREFILLED_SYRINGE | INTRAVENOUS | Status: DC | PRN
  Administered 2018-01-24: 80 mg via INTRAVENOUS

## 2018-01-24 MED ORDER — MIDAZOLAM HCL 2 MG/2ML IJ SOLN
2.0000 mg | Freq: Once | INTRAMUSCULAR | Status: AC
  Administered 2018-01-24: 2 mg via INTRAVENOUS

## 2018-01-24 MED ORDER — CEFAZOLIN SODIUM-DEXTROSE 2-4 GM/100ML-% IV SOLN
INTRAVENOUS | Status: AC
  Filled 2018-01-24: qty 100

## 2018-01-24 MED ORDER — ONDANSETRON HCL 4 MG/2ML IJ SOLN
INTRAMUSCULAR | Status: DC | PRN
  Administered 2018-01-24: 4 mg via INTRAVENOUS

## 2018-01-24 MED ORDER — HYDROCODONE-ACETAMINOPHEN 7.5-325 MG PO TABS: 1.0000 | ORAL_TABLET | Freq: Once | ORAL | Status: DC | PRN

## 2018-01-24 MED ORDER — ACETAMINOPHEN 10 MG/ML IV SOLN: 1000.0000 mg | Freq: Once | INTRAVENOUS | Status: DC | PRN

## 2018-01-24 MED ORDER — VANCOMYCIN HCL 500 MG IV SOLR
INTRAVENOUS | Status: AC
  Filled 2018-01-24: qty 1000

## 2018-01-24 MED ORDER — 0.9 % SODIUM CHLORIDE (POUR BTL) OPTIME
TOPICAL | Status: DC | PRN
  Administered 2018-01-24: 1000 mL

## 2018-01-24 MED ORDER — FENTANYL CITRATE (PF) 250 MCG/5ML IJ SOLN
INTRAMUSCULAR | Status: AC
  Filled 2018-01-24: qty 5

## 2018-01-24 MED ORDER — PROMETHAZINE HCL 25 MG/ML IJ SOLN: 6.2500 mg | INTRAMUSCULAR | Status: DC | PRN

## 2018-01-24 MED ORDER — FLUTICASONE PROPIONATE 50 MCG/ACT NA SUSP
1.0000 | Freq: Every day | NASAL | Status: DC
  Administered 2018-01-25 – 2018-02-01 (×8): 1 via NASAL
  Filled 2018-01-24: qty 16

## 2018-01-24 MED ORDER — ROPIVACAINE HCL 5 MG/ML IJ SOLN
INTRAMUSCULAR | Status: DC | PRN
  Administered 2018-01-24: 30 mL via PERINEURAL
  Administered 2018-01-24: 12 mL via PERINEURAL

## 2018-01-24 MED ORDER — CEFAZOLIN SODIUM-DEXTROSE 1-4 GM/50ML-% IV SOLN
1.0000 g | Freq: Three times a day (TID) | INTRAVENOUS | Status: AC
  Administered 2018-01-24 – 2018-01-25 (×2): 1 g via INTRAVENOUS
  Filled 2018-01-24 (×2): qty 50

## 2018-01-24 MED ORDER — FENTANYL CITRATE (PF) 100 MCG/2ML IJ SOLN
INTRAMUSCULAR | Status: AC
  Administered 2018-01-24: 50 ug via INTRAVENOUS
  Filled 2018-01-24: qty 2

## 2018-01-24 MED ORDER — VANCOMYCIN HCL 500 MG IV SOLR
INTRAVENOUS | Status: DC | PRN
  Administered 2018-01-24: 1000 mg via TOPICAL

## 2018-01-24 MED ORDER — PROPOFOL 10 MG/ML IV BOLUS
INTRAVENOUS | Status: AC
  Filled 2018-01-24: qty 20

## 2018-01-24 MED ORDER — FENTANYL CITRATE (PF) 100 MCG/2ML IJ SOLN
50.0000 ug | Freq: Once | INTRAMUSCULAR | Status: AC
  Administered 2018-01-24: 50 ug via INTRAVENOUS

## 2018-01-24 MED ORDER — PHENYLEPHRINE HCL 10 MG/ML IJ SOLN
INTRAMUSCULAR | Status: DC | PRN
  Administered 2018-01-24: 80 ug via INTRAVENOUS
  Administered 2018-01-24: 40 ug via INTRAVENOUS

## 2018-01-24 MED ORDER — LACTATED RINGERS IV SOLN
INTRAVENOUS | Status: DC | PRN
  Administered 2018-01-24 (×2): via INTRAVENOUS

## 2018-01-24 MED ORDER — PROPOFOL 10 MG/ML IV BOLUS
INTRAVENOUS | Status: DC | PRN
  Administered 2018-01-24: 200 mg via INTRAVENOUS

## 2018-01-24 MED ORDER — MEPERIDINE HCL 50 MG/ML IJ SOLN: 6.2500 mg | INTRAMUSCULAR | Status: DC | PRN

## 2018-01-24 MED ORDER — HYDROMORPHONE HCL 2 MG/ML IJ SOLN: 0.2500 mg | INTRAMUSCULAR | Status: DC | PRN

## 2018-01-24 SURGICAL SUPPLY — 78 items
BANDAGE ACE 4X5 VEL STRL LF (GAUZE/BANDAGES/DRESSINGS) IMPLANT
BANDAGE ACE 6X5 VEL STRL LF (GAUZE/BANDAGES/DRESSINGS) IMPLANT
BANDAGE ELASTIC 4 VELCRO ST LF (GAUZE/BANDAGES/DRESSINGS) ×2 IMPLANT
BANDAGE ESMARK 6X9 LF (GAUZE/BANDAGES/DRESSINGS) IMPLANT
BIT DRILL 2 CANN GRADUATED (BIT) ×2 IMPLANT
BIT DRILL 2.5 CANN ENDOSCOPIC (BIT) ×2 IMPLANT
BIT DRILL 2.6 CANN (BIT) ×2 IMPLANT
BIT DRILL 2.7 (BIT) ×2
BIT DRILL 2.7X2.7/3XSCR ANKL (BIT) IMPLANT
BIT DRL 2.7X2.7/3XSCR ANKL (BIT) ×1
BLADE SURG 15 STRL LF DISP TIS (BLADE) ×1 IMPLANT
BLADE SURG 15 STRL SS (BLADE) ×2
BNDG COHESIVE 4X5 TAN STRL (GAUZE/BANDAGES/DRESSINGS) ×3 IMPLANT
BNDG ESMARK 6X9 LF (GAUZE/BANDAGES/DRESSINGS)
CANISTER SUCT 3000ML PPV (MISCELLANEOUS) ×3 IMPLANT
CLOSURE STERI-STRIP 1/2X4 (GAUZE/BANDAGES/DRESSINGS) ×1
CLSR STERI-STRIP ANTIMIC 1/2X4 (GAUZE/BANDAGES/DRESSINGS) ×1 IMPLANT
COVER SURGICAL LIGHT HANDLE (MISCELLANEOUS) ×3 IMPLANT
CUFF TOURNIQUET SINGLE 34IN LL (TOURNIQUET CUFF) IMPLANT
CUFF TOURNIQUET SINGLE 44IN (TOURNIQUET CUFF) IMPLANT
DRAPE C-ARM 42X72 X-RAY (DRAPES) ×3 IMPLANT
DRAPE C-ARMOR (DRAPES) ×3 IMPLANT
DRAPE INCISE IOBAN 66X45 STRL (DRAPES) IMPLANT
DRAPE U-SHAPE 47X51 STRL (DRAPES) ×3 IMPLANT
DURAPREP 26ML APPLICATOR (WOUND CARE) ×3 IMPLANT
ELECT CAUTERY BLADE 6.4 (BLADE) ×3 IMPLANT
ELECT REM PT RETURN 9FT ADLT (ELECTROSURGICAL) ×3
ELECTRODE REM PT RTRN 9FT ADLT (ELECTROSURGICAL) ×1 IMPLANT
GAUZE SPONGE 4X4 12PLY STRL (GAUZE/BANDAGES/DRESSINGS) ×3 IMPLANT
GAUZE XEROFORM 5X9 LF (GAUZE/BANDAGES/DRESSINGS) ×3 IMPLANT
GLOVE BIOGEL PI IND STRL 7.0 (GLOVE) ×1 IMPLANT
GLOVE BIOGEL PI INDICATOR 7.0 (GLOVE) ×2
GLOVE ECLIPSE 7.0 STRL STRAW (GLOVE) ×3 IMPLANT
GLOVE SKINSENSE NS SZ7.5 (GLOVE) ×2
GLOVE SKINSENSE STRL SZ7.5 (GLOVE) ×1 IMPLANT
GLOVE SURG SYN 7.5  E (GLOVE) ×4
GLOVE SURG SYN 7.5 E (GLOVE) ×2 IMPLANT
GLOVE SURG SYN 7.5 PF PI (GLOVE) ×2 IMPLANT
GOWN STRL REIN XL XLG (GOWN DISPOSABLE) ×3 IMPLANT
GUIDEWIRE 1.35MM (WIRE) ×4 IMPLANT
KIT BASIN OR (CUSTOM PROCEDURE TRAY) ×3 IMPLANT
KIT TURNOVER KIT B (KITS) ×3 IMPLANT
NDL HYPO 25GX1X1/2 BEV (NEEDLE) IMPLANT
NEEDLE HYPO 25GX1X1/2 BEV (NEEDLE) IMPLANT
NS IRRIG 1000ML POUR BTL (IV SOLUTION) ×3 IMPLANT
PACK ORTHO EXTREMITY (CUSTOM PROCEDURE TRAY) ×3 IMPLANT
PAD ABD 8X10 STRL (GAUZE/BANDAGES/DRESSINGS) ×2 IMPLANT
PAD ARMBOARD 7.5X6 YLW CONV (MISCELLANEOUS) ×6 IMPLANT
PAD CAST 4YDX4 CTTN HI CHSV (CAST SUPPLIES) IMPLANT
PADDING CAST ABS 4INX4YD NS (CAST SUPPLIES) ×2
PADDING CAST ABS COTTON 4X4 ST (CAST SUPPLIES) IMPLANT
PADDING CAST COTTON 4X4 STRL (CAST SUPPLIES) ×2
PADDING CAST COTTON 6X4 STRL (CAST SUPPLIES) ×3 IMPLANT
PLATE DISTAL FIBULA 4H LOCKING (Plate) ×2 IMPLANT
SCREW CANN 4X42 SS THRD LG (Screw) ×2 IMPLANT
SCREW LO PRO 2.7X22MM CORTEX (Screw) ×2 IMPLANT
SCREW LOCK T10 FT 18X2.7X (Screw) IMPLANT
SCREW LOCKING 2.7X14MM (Screw) ×4 IMPLANT
SCREW LOCKING 2.7X18MM (Screw) ×2 IMPLANT
SCREW LOW PROFILE 3.5X14 (Screw) ×2 IMPLANT
SCREW LOW PROFILE 3.5X16 (Screw) ×4 IMPLANT
SCREW LP CANN 4.0X44 (Screw) ×2 IMPLANT
SCREW TM SS 2.7X14 CORTEX (Screw) ×2 IMPLANT
SPLINT PLASTER CAST XFAST 5X30 (CAST SUPPLIES) IMPLANT
SPLINT PLASTER XFAST SET 5X30 (CAST SUPPLIES) ×2
SUCTION FRAZIER HANDLE 10FR (MISCELLANEOUS) ×2
SUCTION TUBE FRAZIER 10FR DISP (MISCELLANEOUS) ×1 IMPLANT
SUT ETHILON 3 0 PS 1 (SUTURE) IMPLANT
SUT VIC AB 2-0 CT1 27 (SUTURE)
SUT VIC AB 2-0 CT1 TAPERPNT 27 (SUTURE) IMPLANT
SYR CONTROL 10ML LL (SYRINGE) IMPLANT
TOWEL OR 17X24 6PK STRL BLUE (TOWEL DISPOSABLE) ×3 IMPLANT
TOWEL OR 17X26 10 PK STRL BLUE (TOWEL DISPOSABLE) ×6 IMPLANT
TUBE CONNECTING 12'X1/4 (SUCTIONS) ×1
TUBE CONNECTING 12X1/4 (SUCTIONS) ×2 IMPLANT
UNDERPAD 30X30 (UNDERPADS AND DIAPERS) ×3 IMPLANT
WASHER (Orthopedic Implant) ×4 IMPLANT
WASHER ORTHO 7X (Orthopedic Implant) IMPLANT

## 2018-01-24 NOTE — Anesthesia Procedure Notes (Signed)
Procedure Name: LMA Insertion Date/Time: 01/24/2018 3:39 PM Performed by: Rashay Barnette T, CRNA Pre-anesthesia Checklist: Patient identified, Emergency Drugs available, Suction available and Patient being monitored Patient Re-evaluated:Patient Re-evaluated prior to induction Oxygen Delivery Method: Circle system utilized Preoxygenation: Pre-oxygenation with 100% oxygen Induction Type: IV induction Ventilation: Mask ventilation without difficulty LMA: LMA inserted LMA Size: 4.0 Number of attempts: 1 Airway Equipment and Method: Patient positioned with wedge pillow Placement Confirmation: positive ETCO2 and breath sounds checked- equal and bilateral Tube secured with: Tape Dental Injury: Teeth and Oropharynx as per pre-operative assessment
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
## 2018-01-24 NOTE — Anesthesia Procedure Notes (Addendum)
Anesthesia Regional Block: Popliteal block   Pre-Anesthetic Checklist: ,, timeout performed, Correct Patient, Correct Site, Correct Laterality, Correct Procedure, Correct Position, site marked, Risks and benefits discussed, pre-op evaluation,  At surgeon's request and post-op pain management  Laterality: Left  Prep: Maximum Sterile Barrier Precautions used, chloraprep       Needles:  Injection technique: Single-shot  Needle Type: Echogenic Stimulator Needle     Needle Length: 9cm  Needle Gauge: 21     Additional Needles:   Procedures:, nerve stimulator,,, ultrasound used (permanent image in chart),,,,  Narrative:  Start time: 01/24/2018 2:00 PM End time: 01/24/2018 2:09 PM Injection made incrementally with aspirations every 5 mL. Anesthesiologist: Trevor Iha, MD

## 2018-01-24 NOTE — Anesthesia Procedure Notes (Signed)
Anesthesia Regional Block: Femoral nerve block   Pre-Anesthetic Checklist: ,, timeout performed, Correct Patient, Correct Site, Correct Laterality, Correct Procedure, Correct Position, site marked, Risks and benefits discussed,  Surgical consent,  Pre-op evaluation,  At surgeon's request and post-op pain management  Laterality: Left  Prep: chloraprep       Needles:  Injection technique: Single-shot  Needle Type: Echogenic Stimulator Needle     Needle Length: 5cm  Needle Gauge: 22     Additional Needles:   Procedures:,,,, ultrasound used (permanent image in chart),,,,  Narrative:  Start time: 01/24/2018 1:56 PM End time: 01/24/2018 2:02 PM Injection made incrementally with aspirations every 5 mL.  Performed by: Personally  Anesthesiologist: Trevor IhaHouser, Stephen A, MD  Additional Notes: Functioning IV was confirmed and monitors were applied.  A 50mm 22ga Arrow echogenic stimulator needle was used. Sterile prep and drape,hand hygiene and sterile gloves were used. Ultrasound guidance: relevant anatomy identified, needle position confirmed, local anesthetic spread visualized around nerve(s)., vascular puncture avoided.  Image printed for medical record. Negative aspiration and negative test dose prior to incremental administration of local anesthetic. The patient tolerated the procedure well.

## 2018-01-24 NOTE — Interval H&P Note (Signed)
Discussed case, risks and benefits with patient again.  All questions answered, no change to history.  Dax Varkey MD  
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
## 2018-01-24 NOTE — Anesthesia Preprocedure Evaluation (Addendum)
Anesthesia Evaluation  Patient identified by MRN, date of birth, ID band Patient awake    Reviewed: Allergy & Precautions, NPO status , Patient's Chart, lab work & pertinent test results  Airway Mallampati: II  TM Distance: >3 FB Neck ROM: Full    Dental no notable dental hx. (+) Dental Advisory Given   Pulmonary Current Smoker,    Pulmonary exam normal breath sounds clear to auscultation       Cardiovascular hypertension, Pt. on home beta blockers Normal cardiovascular exam Rhythm:Regular Rate:Normal     Neuro/Psych negative neurological ROS     GI/Hepatic negative GI ROS, Neg liver ROS,   Endo/Other  negative endocrine ROS  Renal/GU negative Renal ROS  negative genitourinary   Musculoskeletal   Abdominal   Peds negative pediatric ROS (+)  Hematology   Anesthesia Other Findings   Reproductive/Obstetrics                           Lab Results  Component Value Date   WBC 8.4 01/23/2018   HGB 11.5 (L) 01/23/2018   HCT 35.1 (L) 01/23/2018   MCV 87.5 01/23/2018   PLT 289 01/23/2018    Anesthesia Physical Anesthesia Plan  ASA: II  Anesthesia Plan: Regional and General   Post-op Pain Management:  Regional for Post-op pain   Induction: Intravenous  PONV Risk Score and Plan: Treatment may vary due to age or medical condition and Ondansetron  Airway Management Planned: LMA  Additional Equipment:   Intra-op Plan:   Post-operative Plan:   Informed Consent: I have reviewed the patients History and Physical, chart, labs and discussed the procedure including the risks, benefits and alternatives for the proposed anesthesia with the patient or authorized representative who has indicated his/her understanding and acceptance.   Dental advisory given  Plan Discussed with: CRNA  Anesthesia Plan Comments:         Anesthesia Quick Evaluation

## 2018-01-24 NOTE — Transfer of Care (Signed)
Immediate Anesthesia Transfer of Care Note  Patient: Wesley Kelly  Procedure(s) Performed: OPEN REDUCTION INTERNAL FIXATION (ORIF) ANKLE FRACTURE (Left Ankle)  Patient Location: PACU  Anesthesia Type:GA combined with regional for post-op pain  Level of Consciousness: awake, alert  and oriented  Airway & Oxygen Therapy: Patient Spontanous Breathing and Patient connected to nasal cannula oxygen  Post-op Assessment: Report given to RN and Post -op Vital signs reviewed and stable  Post vital signs: Reviewed and stable  Last Vitals:  Vitals Value Taken Time  BP 138/92 01/24/2018  5:05 PM  Temp    Pulse 76 01/24/2018  5:13 PM  Resp 21 01/24/2018  5:13 PM  SpO2 99 % 01/24/2018  5:13 PM  Vitals shown include unvalidated device data.  Last Pain:  Vitals:   01/24/18 1415  TempSrc:   PainSc: 0-No pain         Complications: No apparent anesthesia complications
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
## 2018-01-25 MED ORDER — TRAMADOL HCL 50 MG PO TABS
50.0000 mg | ORAL_TABLET | Freq: Four times a day (QID) | ORAL | Status: DC
  Administered 2018-01-25 – 2018-01-27 (×8): 50 mg via ORAL
  Filled 2018-01-25 (×8): qty 1

## 2018-01-25 MED ORDER — METHOCARBAMOL 500 MG PO TABS
500.0000 mg | ORAL_TABLET | Freq: Three times a day (TID) | ORAL | Status: DC
  Administered 2018-01-25 – 2018-02-01 (×22): 500 mg via ORAL
  Filled 2018-01-25 (×22): qty 1

## 2018-01-25 MED ORDER — ACETAMINOPHEN 500 MG PO TABS
1000.0000 mg | ORAL_TABLET | Freq: Three times a day (TID) | ORAL | Status: DC
  Administered 2018-01-25 – 2018-02-01 (×23): 1000 mg via ORAL
  Filled 2018-01-25 (×24): qty 2

## 2018-01-25 MED ORDER — CELECOXIB 100 MG PO CAPS
100.0000 mg | ORAL_CAPSULE | Freq: Every day | ORAL | Status: DC
  Administered 2018-01-25 – 2018-02-01 (×8): 100 mg via ORAL
  Filled 2018-01-25 (×8): qty 1

## 2018-01-25 NOTE — Progress Notes (Addendum)
LOS: 5 days   Subjective: Wesley Kelly is a 57yo M POD1 following ORIF of right fibula  He states he is in a lot of pain. The pain medication is not helping. His last recived ocycodone 10mg  1 hour and 10 minutes ago and it has already worn off. He ate 1/2 his breakfast. He denies any abdominal pain. He denies any chest pain or sob. He is alert and able to communicate about discharge plans.    Objective: Vital signs in last 24 hours: Temp:  [98.1 F (36.7 C)-100.4 F (38 C)] 100.4 F (38 C) (04/18 0545) Pulse Rate:  [63-93] 93 (04/18 0545) Resp:  [13-24] 19 (04/18 0545) BP: (106-138)/(68-92) 117/79 (04/18 0545) SpO2:  [95 %-100 %] 97 % (04/18 0545) Weight:  [56.2 kg (123 lb 14.4 oz)] 56.2 kg (123 lb 14.4 oz) (04/17 1344) Last BM Date: 01/21/18   Laboratory  CBC Recent Labs    01/23/18 1102  WBC 8.4  HGB 11.5*  HCT 35.1*  PLT 289   BMET Recent Labs    01/23/18 1102  NA 139  K 3.6  CL 105  CO2 25  GLUCOSE 96  BUN <5*  CREATININE 1.00  CALCIUM 8.3*     Physical Exam General: Alert and oriented HEENT: normocephalic Cardio: RRR resp: non labored breathing, CTABL   Extremities: splint to LLE intact, warm toes, able to move toes    Assessment/Plan:  Wesley Kelly is a 57yoM POD#1 following ORIF of left fibula - pain adequately controled with 1,000 tylenol q 8hr, 5-10mg  oxycodone q 4 PRN,  - will add sheduled ultram and robaxin - NWB in splint. - Follow up 14 days for incision check and XR out of splint w transition to boot but TDWB for 6 weeks.  TBI: - stable, patient alert and oriented   Dispo: pending PT/OT recommendation Diet: normal  DVT prof: lovenox 40 sq q day, asprin at discharge  Wesley Kelly  General Trauma PA pager [phone]  01/25/2018

## 2018-01-25 NOTE — Progress Notes (Signed)
ORTHOPAEDIC PROGRESS NOTE  s/p Procedure(s): OPEN REDUCTION INTERNAL FIXATION (ORIF) ANKLE FRACTURE  SUBJECTIVE: Patient block just wore off and having pain, appropriate otherwise  OBJECTIVE: PE: splint CDI, +EHL though remainder of motor difficult to test due to splint, sensation intact distally with warm well perfused foot, no pain w passive stretch  Vitals:   01/24/18 2120 01/25/18 0545  BP: 119/74 117/79  Pulse: 82 93  Resp: 17 19  Temp: 99.4 F (37.4 C) (!) 100.4 F (38 C)  SpO2: 99% 97%     ASSESSMENT: Wesley Kelly is a 57 y.o. male doing appropriately postop with some block related pain.  PLAN: Weightbearing: NWB LLE Insicional and dressing care: Dressings left intact until follow-up Orthopedic device(s): Splint Showering: Keep splint dry VTE prophylaxis: Aspirin 81mg  qd would be appropriate from otherpedic standpoint defer to trauma Pain control: PRN meds, minimize narcs as possible.  Standard pain protocol postop for my practice is Tylenol 1000mg  q8, meloxicam 7.5 qd and Oxy 5 #20 pills. Follow - up plan: 2 weeks, ok to dc at Trauma discretion, typically ankle ORIF is outpatient surgery. Contact information:  Weekdays 8-5 Ramond Marrowax Algis Lehenbauer MD [phone], After hours and holidays please check [email or web address] for group call information for [facility]
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
## 2018-01-26 ENCOUNTER — Encounter (HOSPITAL_COMMUNITY): Payer: Self-pay | Admitting: Orthopaedic Surgery

## 2018-01-26 NOTE — Plan of Care (Signed)
  Problem: Health Behavior/Discharge Planning: Goal: Ability to manage health-related needs will improve Outcome: Progressing   Problem: Clinical Measurements: Goal: Ability to maintain clinical measurements within normal limits will improve Outcome: Progressing Goal: Will remain free from infection Outcome: Progressing Goal: Diagnostic test results will improve Outcome: Progressing Goal: Respiratory complications will improve Outcome: Progressing Goal: Cardiovascular complication will be avoided Outcome: Progressing   Problem: Activity: Goal: Risk for activity intolerance will decrease Outcome: Progressing   Problem: Elimination: Goal: Will not experience complications related to bowel motility Outcome: Progressing Goal: Will not experience complications related to urinary retention Outcome: Progressing   Problem: Pain Managment: Goal: General experience of comfort will improve Outcome: Progressing   Problem: Skin Integrity: Goal: Risk for impaired skin integrity will decrease Outcome: Progressing   Problem: Education: Goal: Knowledge of the prescribed therapeutic regimen Outcome: Progressing Goal: Knowledge of disease or condition will improve Outcome: Progressing   Problem: Clinical Measurements: Goal: Neurologic status will improve Outcome: Progressing   Problem: Tissue Perfusion: Goal: Ability to maintain intracranial pressure will improve Outcome: Progressing   Problem: Respiratory: Goal: Ability to maintain adequate ventilation will improve Outcome: Progressing   Problem: Skin Integrity: Goal: Risk for impaired skin integrity will decrease Outcome: Progressing Goal: Demonstration of wound healing without infection will improve Outcome: Progressing   Problem: Psychosocial: Goal: Ability to verbalize positive feelings about self will improve Outcome: Progressing Goal: Ability to participate in self-care as condition permits will improve Outcome:  Progressing Goal: Ability to identify appropriate support needs will improve Outcome: Progressing   Problem: Health Behavior/Discharge Planning: Goal: Ability to manage health-related needs will improve Outcome: Progressing   Problem: Nutritional: Goal: Risk of aspiration will decrease Outcome: Progressing Goal: Dietary intake will improve Outcome: Progressing   Problem: Communication: Goal: Ability to communicate needs accurately will improve Outcome: Progressing   Problem: Spiritual Needs Goal: Ability to function at adequate level Outcome: Progressing
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
## 2018-01-26 NOTE — Clinical Social Work Note (Signed)
Per AD of CSW we will not do a LOG.  AkinsBridget Juandaniel Manfredo, [place] [phone]

## 2018-01-26 NOTE — Anesthesia Postprocedure Evaluation (Addendum)
Anesthesia Post Note  Patient: Wesley Kelly  Procedure(s) Performed: OPEN REDUCTION INTERNAL FIXATION (ORIF) ANKLE FRACTURE (Left Ankle)     Patient location during evaluation: PACU Anesthesia Type: Regional and General Level of consciousness: sedated and patient cooperative Pain management: pain level controlled Vital Signs Assessment: post-procedure vital signs reviewed and stable Respiratory status: spontaneous breathing Cardiovascular status: stable Anesthetic complications: no    Last Vitals:  Vitals:   01/25/18 1433 01/25/18 2110  BP: 101/70 106/74  Pulse: 68 71  Resp: 12 18  Temp: 37.1 C 37.2 C  SpO2: 95% 95%    Last Pain:  Vitals:   01/25/18 2300  TempSrc:   PainSc: 0-No pain   Pain Goal:                 Lewie LoronJohn Zeya Balles

## 2018-01-27 MED ORDER — TRAMADOL HCL 50 MG PO TABS
100.0000 mg | ORAL_TABLET | Freq: Four times a day (QID) | ORAL | Status: DC
  Administered 2018-01-27 – 2018-02-01 (×21): 100 mg via ORAL
  Filled 2018-01-27 (×21): qty 2

## 2018-01-27 NOTE — Plan of Care (Signed)
Pt remains free from infection. Pt denies constipation.
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
## 2018-01-29 NOTE — Progress Notes (Signed)
Pt states that his pain got worse over the night despite his pain medications (PRN and scheduled). He rates his pain 9/10 and describes it as "lot of pressure under the cast". Pt is able to move his toes, his foot is warm to touch and he has good pulse (+2). Pt's LLE is elevated, ice applied. Nursing will continue to monitor.

## 2018-01-29 NOTE — Social Work (Signed)
CSW contacted [facility] and left message for admission staff.  CSW will f/u for SNF [place].  Keene BreathPatricia Kaaliyah Kita, LCSW Clinical Social Worker [phone]
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
## 2018-01-29 NOTE — Clinical Social Work Note (Signed)
Clinical Social Work Assessment  Patient Details  Name: Wesley Kelly MRN: [id number] Date of Birth: Dec 23, 1960  Date of referral:  01/29/18               Reason for consult:  Facility Placement                Permission sought to share information with:  Oceanographeracility Contact Representative Permission granted to share information::  Yes, Release of Information Signed  Name::        Agency::  [facility]  Relationship::     Contact Information:     Housing/Transportation Living arrangements for the past 2 months:  Single Family Home Source of Information:  Patient Patient Interpreter Needed:  None Criminal Activity/Legal Involvement Pertinent to Current Situation/Hospitalization:  No - Comment as needed Significant Relationships:  Other Family Members, Siblings Lives with:  Siblings Do you feel safe going back to the place where you live?  No Need for family participation in patient care:  No (Coment)  Care giving concerns:  [place] and will stay with sister. [place] indicated [place]. CSW explained the SNF process and placement with patient. Pt agreeable with SNF and has been to SNF when he resided in [place]. Pt indicated that [place] about 2 years ago. CSW will f/u for SNF placement.  Social Worker assessment / plan:  CSW will work up patient and complete referral [place] SNF's [place].  CSW will f/u for disposition.   Employment status:  Unemployed Health and safety inspectornsurance information:  [place] PT Recommendations:  Skilled Nursing Facility Information / Referral to community resources:  Skilled Nursing Facility  Patient/Family's Response to care:  Patient agreeable to SNF and he thanked CSW for her assistance with discharge planning.  Patient/Family's Understanding of and Emotional Response to Diagnosis, Current Treatment, and Prognosis:  Patient understands new impairment and agreeable to SNF placement at discharge. Pt  will stay with sister in [place] once he has completed short term rehab. No issues identified. CSW will f/uf or placement.  Emotional Assessment Appearance:  Appears stated age Attitude/Demeanor/Rapport:  (Cooperative) Affect (typically observed):  Accepting, Appropriate Orientation:  Oriented to Situation, Oriented to  Time, Oriented to Place, Oriented to Self Alcohol / Substance use:  Not Applicable Psych involvement (Current and /or in the community):  No (Comment)  Discharge Needs  Concerns to be addressed:  Discharge Planning Concerns Readmission within the last 30 days:  No Current discharge risk:  Physical Impairment, Dependent with Mobility Barriers to Discharge:  No Barriers Identified   Tresa Mooreatricia V Tailynn Armetta, LCSW 01/29/2018, 3:21 PM
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
## 2018-01-30 NOTE — Social Work (Signed)
CSW received call back [facility]. CSW then faxed clinicals [facility] to review. SNF will f/u with CSW if they can make a SNF bed offer.  CSW will f/u for placement.  Keene BreathPatricia Ezelle Surprenant, LCSW Clinical Social Worker [phone]

## 2018-01-30 NOTE — Plan of Care (Signed)
  Problem: Health Behavior/Discharge Planning: Goal: Ability to manage health-related needs will improve Outcome: Progressing   Problem: Clinical Measurements: Goal: Ability to maintain clinical measurements within normal limits will improve Outcome: Progressing Goal: Will remain free from infection Outcome: Progressing Goal: Diagnostic test results will improve Outcome: Progressing Goal: Respiratory complications will improve Outcome: Progressing Goal: Cardiovascular complication will be avoided Outcome: Progressing   Problem: Skin Integrity: Goal: Risk for impaired skin integrity will decrease Outcome: Progressing   Problem: Clinical Measurements: Goal: Neurologic status will improve Outcome: Progressing   Problem: Tissue Perfusion: Goal: Ability to maintain intracranial pressure will improve Outcome: Progressing   Problem: Respiratory: Goal: Ability to maintain adequate ventilation will improve Outcome: Progressing   Problem: Skin Integrity: Goal: Risk for impaired skin integrity will decrease Outcome: Progressing Goal: Demonstration of wound healing without infection will improve Outcome: Progressing   Problem: Psychosocial: Goal: Ability to verbalize positive feelings about self will improve Outcome: Progressing Goal: Ability to participate in self-care as condition permits will improve Outcome: Progressing Goal: Ability to identify appropriate support needs will improve Outcome: Progressing   Problem: Health Behavior/Discharge Planning: Goal: Ability to manage health-related needs will improve Outcome: Progressing   Problem: Nutritional: Goal: Risk of aspiration will decrease Outcome: Progressing Goal: Dietary intake will improve Outcome: Progressing   Problem: Communication: Goal: Ability to communicate needs accurately will improve Outcome: Progressing
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
## 2018-01-31 NOTE — Plan of Care (Signed)
  Problem: Skin Integrity: Goal: Risk for impaired skin integrity will decrease Outcome: Progressing   Problem: Respiratory: Goal: Ability to maintain adequate ventilation will improve Outcome: Progressing   Problem: Skin Integrity: Goal: Risk for impaired skin integrity will decrease Outcome: Progressing   Problem: Nutritional: Goal: Risk of aspiration will decrease Outcome: Progressing

## 2018-01-31 NOTE — Progress Notes (Signed)
Per PT/OT recommendations on 01/31/18, pt still requires SNF level of care at discharge, due to decreased gait mobility and poor balance.  Pt NWB on LLE and very limited by pain.  Spoke with CSW, who is continuing to explore options for SNF, though currently none available.  CSW to follow up 4/25.    Quintella BatonJulie W. Ryatt Corsino, RN, BSN  Trauma/Neuro ICU Case Manager [phone]
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
## 2018-02-01 MED ORDER — ACETAMINOPHEN 500 MG PO TABS: 1000.0000 mg | ORAL_TABLET | Freq: Three times a day (TID) | ORAL | 0 refills | Status: AC

## 2018-02-01 MED ORDER — PREGABALIN 75 MG PO CAPS: 75.0000 mg | ORAL_CAPSULE | Freq: Two times a day (BID) | ORAL | 0 refills | Status: AC

## 2018-02-01 MED ORDER — OXYCODONE HCL 5 MG PO TABS: 5.0000 mg | ORAL_TABLET | Freq: Four times a day (QID) | ORAL | 0 refills | Status: AC | PRN

## 2018-02-01 MED ORDER — TRAMADOL HCL 50 MG PO TABS: 100.0000 mg | ORAL_TABLET | Freq: Four times a day (QID) | ORAL | 0 refills | Status: AC | PRN

## 2018-02-01 MED ORDER — ASPIRIN 81 MG PO TABS: 81.0000 mg | ORAL_TABLET | Freq: Every day | ORAL | 0 refills | Status: AC
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
## 2018-02-01 NOTE — Social Work (Addendum)
CSW called [facility] and spoke with admissions staff Darl PikesSusan who indicated that she will review and contact CSW.  CSW contacted [facility] [phone] and the admission staff will f/u and get back to CSW.  Keene BreathPatricia Sy Saintjean, LCSW Clinical Social Worker [phone]
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
## 2018-02-01 NOTE — Progress Notes (Signed)
Pt discharged to home in stable condition.  All discharge instructions reviewed with pt.  Rx's given to [facility].  Pt transported via cart with transporters x 2. [id number]
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
## 2019-03-29 IMAGING — DX DG ANKLE 2V *L*
1 series · 2 of 2 positions shown · non-contrast
Comparison: Plain films of the left ankle 01/21/2018.

CLINICAL DATA: Status post reduction of ankle fractures.

EXAM:
LEFT ANKLE - 2 VIEW

[Series 1: ankle · 0.14mm/px · 2 of 2 slices shown]
[im 1/2]
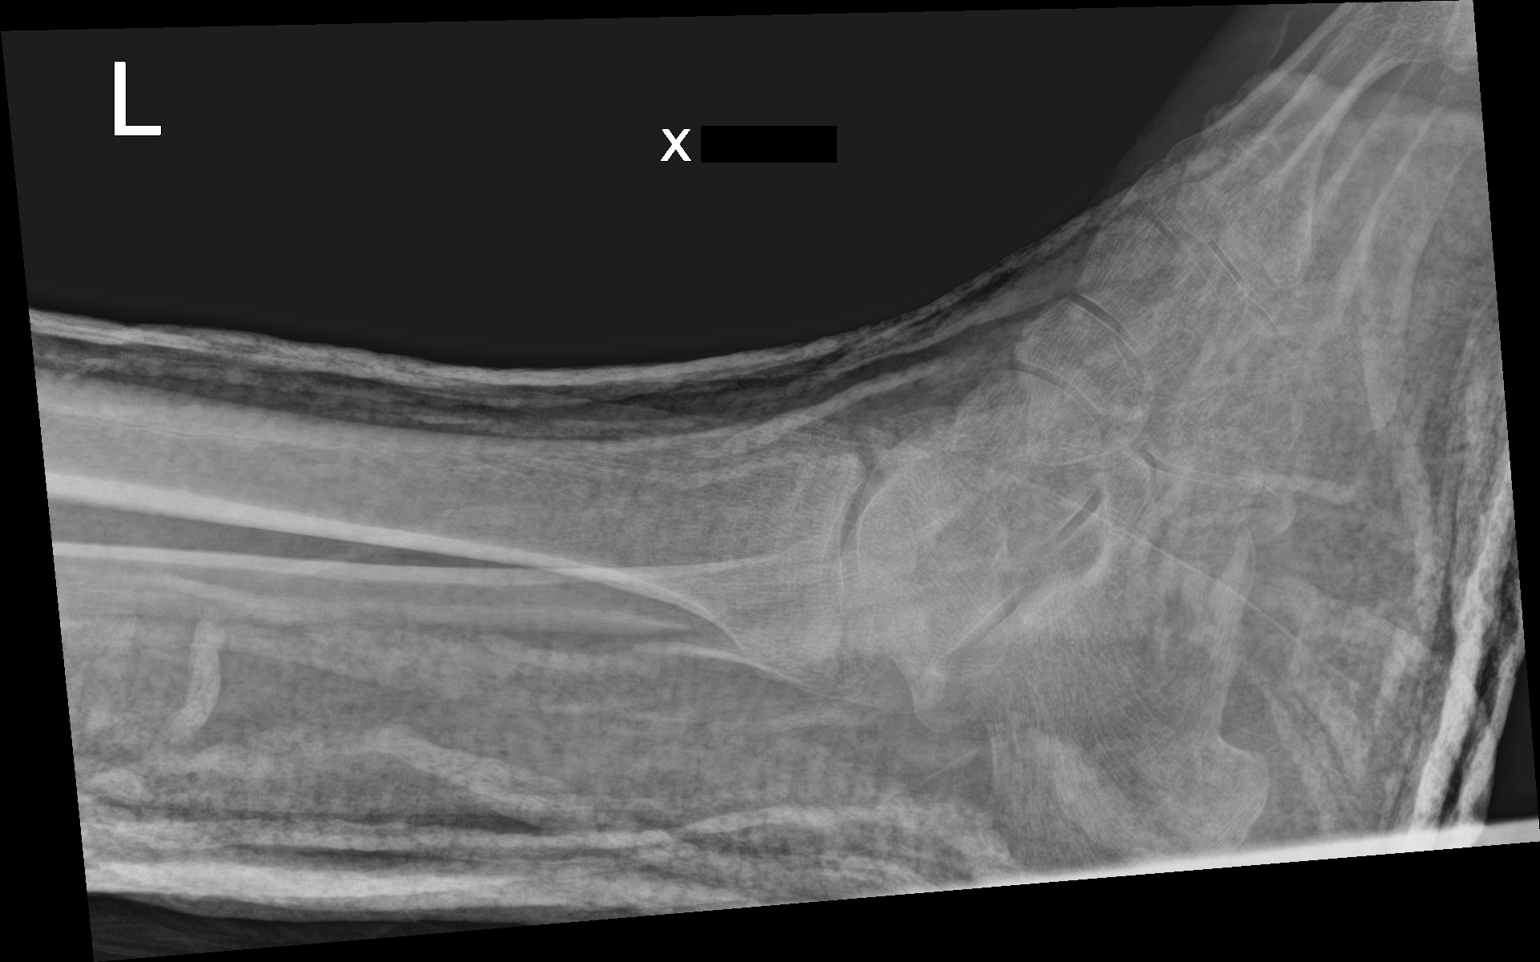
[im 2/2]
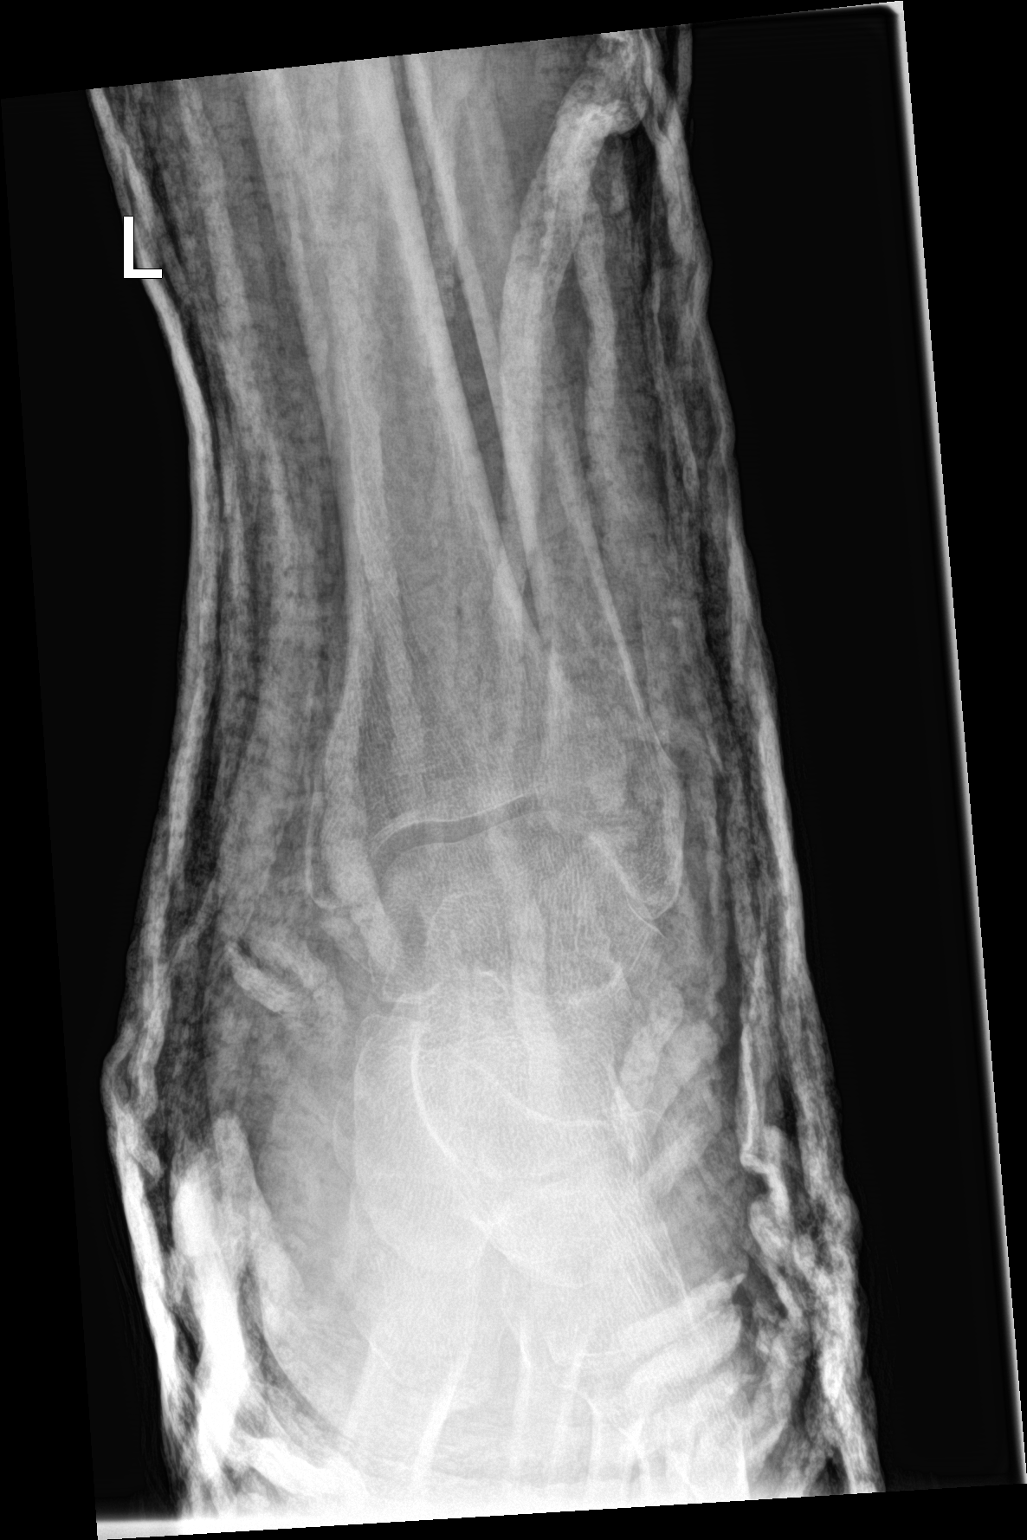

[2 of 2 positions shown; findings below may reference images not displayed]

FINDINGS: The patient is in a new plaster cast obscuring bony detail. Position
and alignment of the patient's medial and lateral malleolar
fractures are markedly improved. Lateral subluxation of the talus
out of the tibiotalar joint has been reduced. No new abnormality.
IMPRESSION: Marked improvement in position and alignment of the patient's medial
and lateral malleolar fracture. No new abnormality.
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
